# Patient Record
Sex: Female | Born: 1950 | Race: White | Hispanic: No | Marital: Married | Smoking: Never smoker
Health system: Southern US, Community
[De-identification: ages and names within clinical notes are randomized; demographics above are authoritative.]

## PROBLEM LIST (undated history)

## (undated) DIAGNOSIS — M1990 Unspecified osteoarthritis, unspecified site: Secondary | ICD-10-CM

## (undated) DIAGNOSIS — K5909 Other constipation: Secondary | ICD-10-CM

## (undated) DIAGNOSIS — G709 Myoneural disorder, unspecified: Secondary | ICD-10-CM

## (undated) DIAGNOSIS — J45909 Unspecified asthma, uncomplicated: Secondary | ICD-10-CM

## (undated) DIAGNOSIS — T7840XA Allergy, unspecified, initial encounter: Secondary | ICD-10-CM

## (undated) DIAGNOSIS — C801 Malignant (primary) neoplasm, unspecified: Secondary | ICD-10-CM

## (undated) HISTORY — DX: Other constipation: K59.09

## (undated) HISTORY — DX: Myoneural disorder, unspecified: G70.9

## (undated) HISTORY — DX: Unspecified osteoarthritis, unspecified site: M19.90

## (undated) HISTORY — DX: Allergy, unspecified, initial encounter: T78.40XA

## (undated) HISTORY — DX: Malignant (primary) neoplasm, unspecified: C80.1

## (undated) HISTORY — PX: BREAST SURGERY: SHX581

## (undated) HISTORY — PX: TUBAL LIGATION: SHX77

## (undated) HISTORY — DX: Unspecified asthma, uncomplicated: J45.909

---

## 2014-05-05 ENCOUNTER — Ambulatory Visit (INDEPENDENT_AMBULATORY_CARE_PROVIDER_SITE_OTHER): Payer: Managed Care, Other (non HMO)

## 2014-05-05 ENCOUNTER — Ambulatory Visit (INDEPENDENT_AMBULATORY_CARE_PROVIDER_SITE_OTHER): Payer: Managed Care, Other (non HMO) | Admitting: Emergency Medicine

## 2014-05-05 VITALS — BP 140/88 | HR 94 | Temp 98.1°F | Resp 16 | Ht 65.0 in | Wt 126.4 lb

## 2014-05-05 DIAGNOSIS — M25512 Pain in left shoulder: Secondary | ICD-10-CM

## 2014-05-05 MED ORDER — IBUPROFEN 600 MG PO TABS: 600.0000 mg | ORAL_TABLET | Freq: Three times a day (TID) | ORAL | Status: AC | PRN

## 2014-05-05 NOTE — Progress Notes (Signed)
   Subjective:    Patient ID: Tracy Lyons, female    DOB: June 21, 1950, 63 y.o.   MRN: [id number]  HPI Patient presents with 8 days of left sideded shoulder pain following falling in her garden. Pain radiates down to her hand and is achy and constant. Denies loss of function, sensation, or numbness in the extremity or in the digits. Denies h/o of osteoporosis, but does have arthritis. Did not hit head.   Review of Systems  Constitutional: Negative for activity change.  Musculoskeletal: Positive for myalgias and arthralgias (baseline). Negative for back pain, joint swelling, neck pain and neck stiffness.  Neurological: Negative for dizziness, weakness, numbness and headaches.       Objective:   Physical Exam  Constitutional: She is oriented to person, place, and time. She appears well-developed and well-nourished. No distress.  Blood pressure 140/88, pulse 94, temperature 98.1 F (36.7 C), temperature source Oral, resp. rate 16, height 5\' 5"  (1.651 m), weight 126 lb 6.4 oz (57.335 kg), SpO2 97 %.  HENT:  Head: Normocephalic and atraumatic.  Right Ear: External ear normal.  Left Ear: External ear normal.  Eyes: Pupils are equal, round, and reactive to light.  Neck: Normal range of motion.  Musculoskeletal: She exhibits no tenderness.       Left shoulder: She exhibits decreased range of motion (pain with internal rotation and adduction) and pain. She exhibits no tenderness, no bony tenderness, no swelling, no effusion, no crepitus, no deformity, no laceration, no spasm, normal pulse and normal strength.       Left elbow: Normal.       Left wrist: Normal.       Cervical back: Normal.       Right hand: Normal.  Neurological: She is alert and oriented to person, place, and time. She has normal strength and normal reflexes. She displays no atrophy. No cranial nerve deficit or sensory deficit. She exhibits normal muscle tone. Coordination normal.  Skin: Skin is warm and dry. No rash noted.  She is not diaphoretic. No erythema. No pallor.   [facility] reading (PRIMARY) by  Dr. Ouida Sills. Fracture of humeral head. Possibly chronic.      Assessment & Plan:  1. Pain in joint, shoulder region, left Patient leaving for [place] for the next 3 months so was given handwritten ref to ortho and placed into sling. - DG Shoulder Left; Future - ibuprofen (ADVIL,MOTRIN) 600 MG tablet; Take 1 tablet (600 mg total) by mouth every 8 (eight) hours as needed.  Dispense: 30 tablet; Refill: 0   Tracy Spinner PA-C  [facility] 05/05/2014 2:48 PM

## 2015-08-08 IMAGING — CR DG SHOULDER 2+V*L*
5 series · 5 of 5 positions shown · non-contrast
Comparison: None.

CLINICAL DATA: Left-sided shoulder pain status post fall.

EXAM:
LEFT SHOULDER - 2+ VIEW

[AP (1 of 2)]
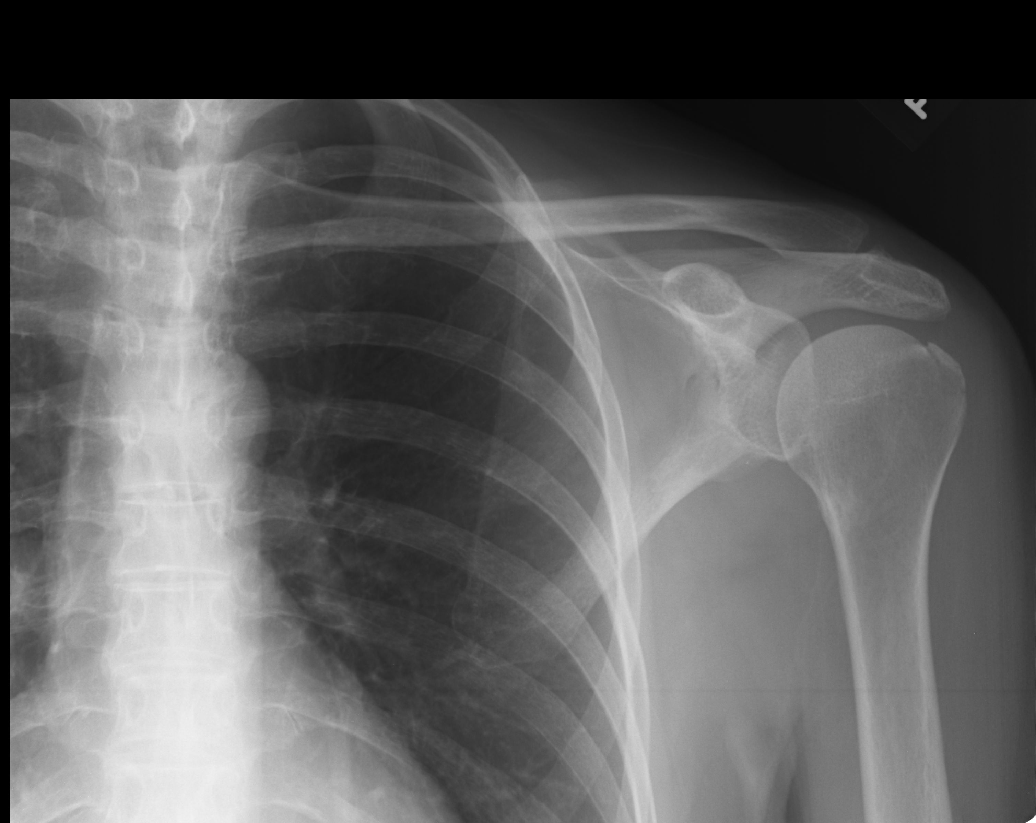

[AP (2 of 2)]
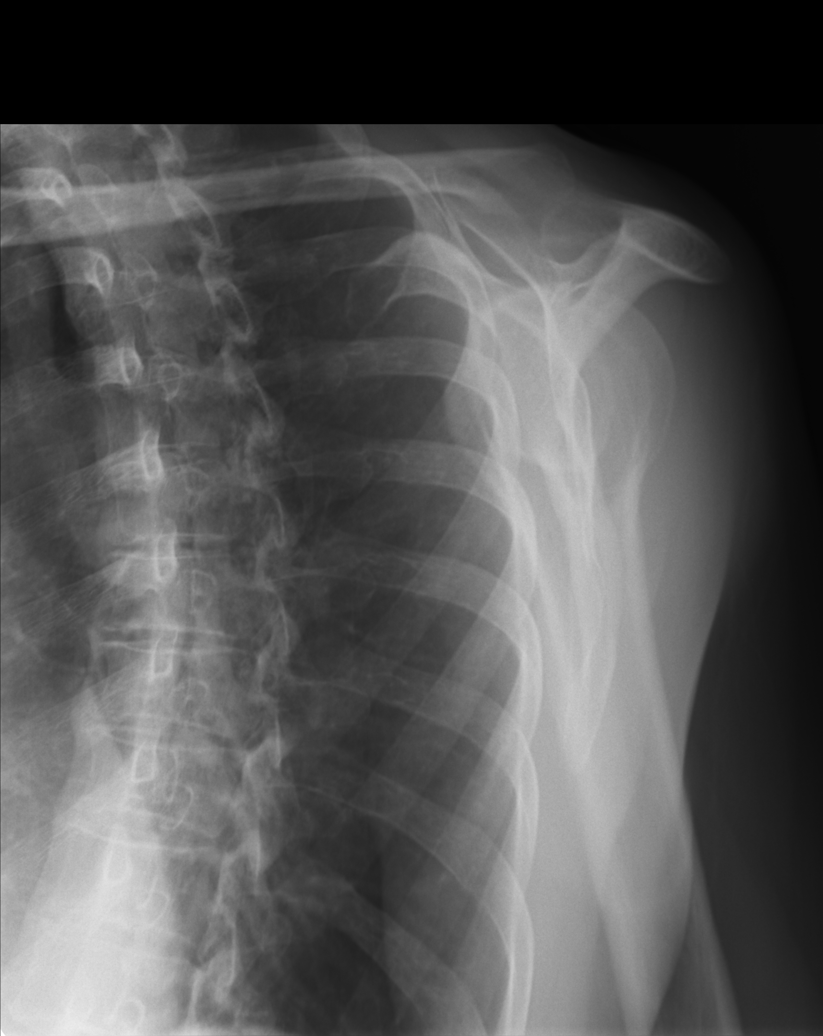

[axial]
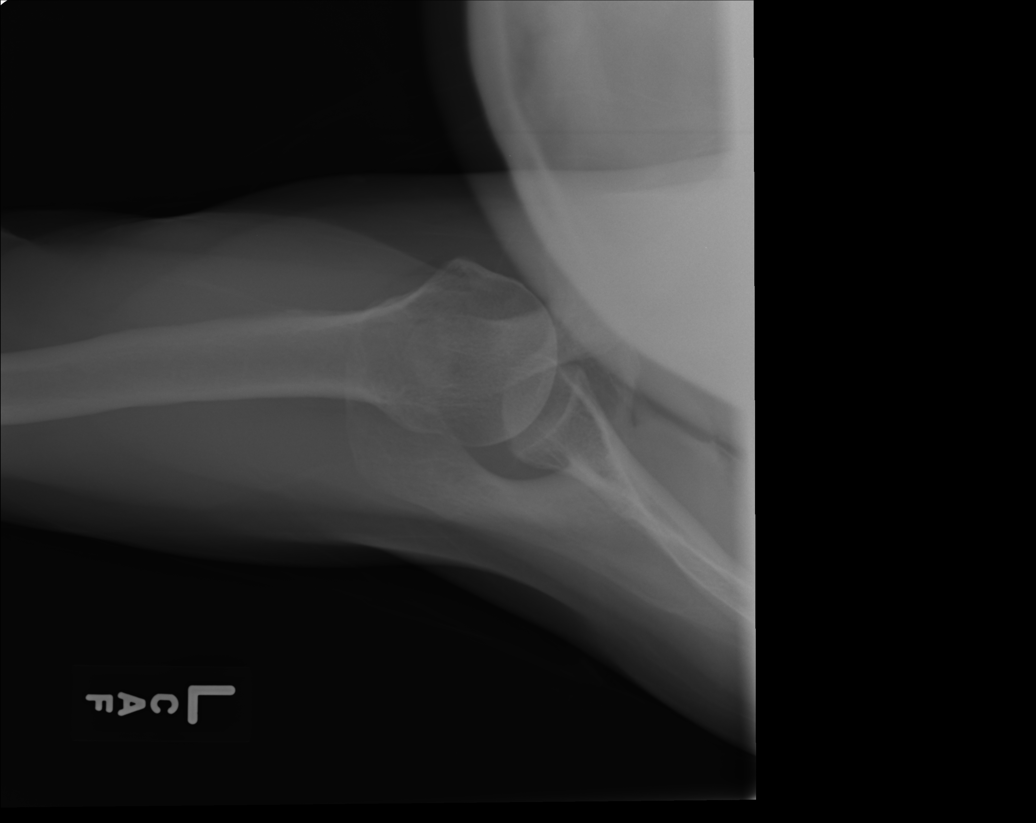

[ap int rot]
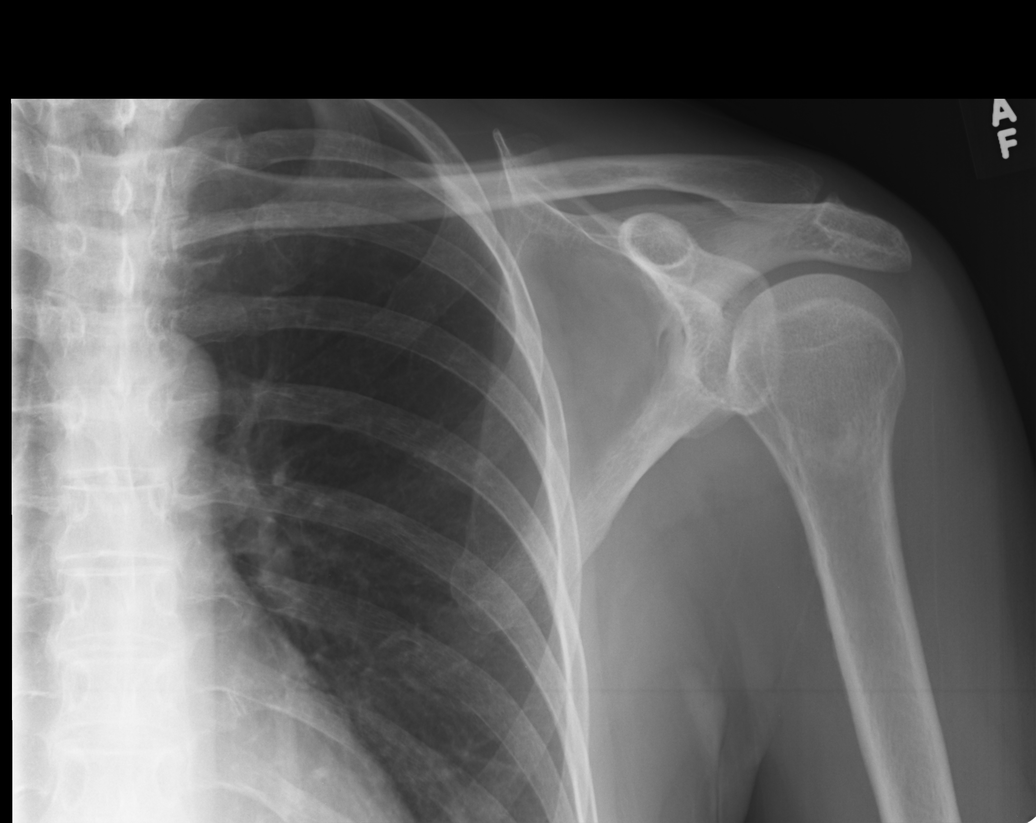

[ap ext rot]
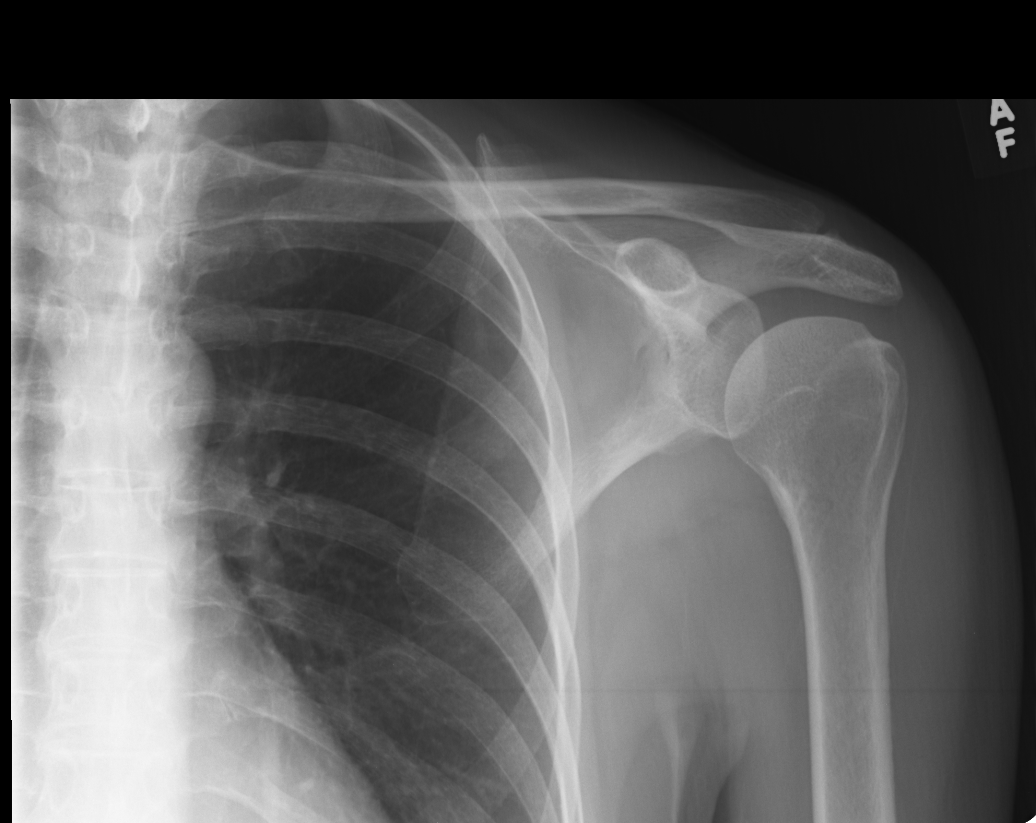

[5 of 5 positions shown; findings below may reference images not displayed]

FINDINGS: Subtle linear lucency along the greater tuberosity of the left
humeral head. No evidence for dislocation. Visualized left hemi
thorax is unremarkable.
IMPRESSION: Findings compatible with subtle fracture of the humeral head in the
region of the greater tuberosity.
